# Patient Record
Sex: Male | Born: 1955 | Race: White | Hispanic: No | Marital: Single | Smoking: Current every day smoker
Health system: Southern US, Community
[De-identification: ages and names within clinical notes are randomized; demographics above are authoritative.]

## PROBLEM LIST (undated history)

## (undated) DIAGNOSIS — R569 Unspecified convulsions: Secondary | ICD-10-CM

## (undated) DIAGNOSIS — F1010 Alcohol abuse, uncomplicated: Secondary | ICD-10-CM

---

## 2017-02-12 ENCOUNTER — Emergency Department (HOSPITAL_COMMUNITY): Payer: Medicare Other

## 2017-02-12 ENCOUNTER — Encounter (HOSPITAL_COMMUNITY): Payer: Self-pay | Admitting: Emergency Medicine

## 2017-02-12 ENCOUNTER — Emergency Department (HOSPITAL_COMMUNITY)
Admission: EM | Admit: 2017-02-12 | Discharge: 2017-02-12 | Disposition: A | Discharge status: Home / Self Care | Payer: Medicare Other | Attending: Emergency Medicine | Admitting: Emergency Medicine

## 2017-02-12 DIAGNOSIS — R930 Abnormal findings on diagnostic imaging of skull and head, not elsewhere classified: Secondary | ICD-10-CM | POA: Diagnosis not present

## 2017-02-12 DIAGNOSIS — Z79899 Other long term (current) drug therapy: Secondary | ICD-10-CM | POA: Diagnosis not present

## 2017-02-12 DIAGNOSIS — Z7982 Long term (current) use of aspirin: Secondary | ICD-10-CM | POA: Diagnosis not present

## 2017-02-12 DIAGNOSIS — R569 Unspecified convulsions: Secondary | ICD-10-CM | POA: Diagnosis present

## 2017-02-12 DIAGNOSIS — F17210 Nicotine dependence, cigarettes, uncomplicated: Secondary | ICD-10-CM | POA: Insufficient documentation

## 2017-02-12 HISTORY — DX: Alcohol abuse, uncomplicated: F10.10

## 2017-02-12 HISTORY — DX: Unspecified convulsions: R56.9

## 2017-02-12 LAB — CBC WITH DIFFERENTIAL/PLATELET
BASOS ABS: 0.1 10*3/uL (ref 0.0–0.1)
BASOS PCT: 1 %
Eosinophils Absolute: 0.2 10*3/uL (ref 0.0–0.7)
Eosinophils Relative: 2 %
HEMATOCRIT: 45.3 % (ref 39.0–52.0)
HEMOGLOBIN: 15.7 g/dL (ref 13.0–17.0)
Lymphocytes Relative: 28 %
Lymphs Abs: 2.4 10*3/uL (ref 0.7–4.0)
MCH: 31 pg (ref 26.0–34.0)
MCHC: 34.7 g/dL (ref 30.0–36.0)
MCV: 89.3 fL (ref 78.0–100.0)
Monocytes Absolute: 0.7 10*3/uL (ref 0.1–1.0)
Monocytes Relative: 9 %
Neutro Abs: 5.3 10*3/uL (ref 1.7–7.7)
Neutrophils Relative %: 60 %
Platelets: 321 10*3/uL (ref 150–400)
RBC: 5.07 MIL/uL (ref 4.22–5.81)
RDW: 15.5 % (ref 11.5–15.5)
WBC: 8.6 10*3/uL (ref 4.0–10.5)

## 2017-02-12 LAB — ETHANOL: ALCOHOL ETHYL (B): 7 mg/dL — AB (ref <5)

## 2017-02-12 LAB — COMPREHENSIVE METABOLIC PANEL
ALBUMIN: 4 g/dL (ref 3.5–5.0)
ALK PHOS: 91 U/L (ref 38–126)
ALT: 17 U/L (ref 17–63)
AST: 39 U/L (ref 15–41)
Anion gap: 17 — ABNORMAL HIGH (ref 5–15)
BUN: 7 mg/dL (ref 6–20)
CO2: 19 mmol/L — AB (ref 22–32)
Calcium: 9.3 mg/dL (ref 8.9–10.3)
Chloride: 99 mmol/L — ABNORMAL LOW (ref 101–111)
Creatinine, Ser: 0.94 mg/dL (ref 0.61–1.24)
GFR calc Af Amer: >60 mL/min (ref >60)
GFR calc non Af Amer: >60 mL/min (ref >60)
GLUCOSE: 85 mg/dL (ref 65–99)
Potassium: 3.8 mmol/L (ref 3.5–5.1)
Sodium: 135 mmol/L (ref 135–145)
TOTAL PROTEIN: 7.1 g/dL (ref 6.5–8.1)
Total Bilirubin: 1.3 mg/dL — ABNORMAL HIGH (ref 0.3–1.2)

## 2017-02-12 LAB — URINALYSIS, ROUTINE W REFLEX MICROSCOPIC
BILIRUBIN URINE: NEGATIVE
Glucose, UA: NEGATIVE mg/dL
KETONES UR: NEGATIVE mg/dL
Leukocytes, UA: NEGATIVE
Nitrite: NEGATIVE
Protein, ur: NEGATIVE mg/dL
Specific Gravity, Urine: 1.009 (ref 1.005–1.030)
pH: 7 (ref 5.0–8.0)

## 2017-02-12 LAB — RAPID URINE DRUG SCREEN, HOSP PERFORMED
AMPHETAMINES: NOT DETECTED
Barbiturates: NOT DETECTED
Benzodiazepines: POSITIVE — AB
Cocaine: NOT DETECTED
OPIATES: POSITIVE — AB
Tetrahydrocannabinol: NOT DETECTED

## 2017-02-12 LAB — CBG MONITORING, ED: Glucose-Capillary: 93 mg/dL (ref 65–99)

## 2017-02-12 MED ORDER — LORAZEPAM 2 MG/ML IJ SOLN
1.0000 mg | Freq: Once | INTRAMUSCULAR | Status: AC
  Administered 2017-02-12: 1 mg via INTRAVENOUS
  Filled 2017-02-12: qty 1

## 2017-02-12 MED ORDER — SODIUM CHLORIDE 0.9 % IV BOLUS (SEPSIS)
1000.0000 mL | Freq: Once | INTRAVENOUS | Status: AC
  Administered 2017-02-12: 1000 mL via INTRAVENOUS

## 2017-02-12 NOTE — ED Provider Notes (Signed)
[facility]-EMERGENCY DEPT Provider Note   CSN: [id number] Arrival date & time: 02/12/17  1609     History   Chief Complaint Chief Complaint  Patient presents with  . Seizures    HPI Adonte Vanriper is a 61 y.o. male.  HPI  61 year old male with a history of a couple seizures in the last 10 years that had 2 back-to-back tonic-clonic appearing episodes today witnessed by his daughter. He said he felt lightheaded and dizzy throughout the day and his daughter witnessed him lose consciousness and had jerking of both arms and legs. No tongue biting or incontinence. Sounds like he woke up within a few minutes was back to normal but a little bit confused about what had happened but otherwise normal. While he was unconscious his eyes were deviated up and to the left. There is a question about whether he is an alcoholic, however voices he doesn't drink more than a couple per day and he actually had some today. No headaches or vision changes. At this time no neck pain, back pain, dull pain, chest pain, headache or other associated or modifying factors. Does not take medicine for seizures at home. He was on chronic benzodiazepines but stopped those a couple months ago  Past Medical History:  Diagnosis Date  . Alcohol abuse   . Seizures (HCC)     There are no active problems to display for this patient.   History reviewed. No pertinent surgical history.     Home Medications    Prior to Admission medications   Medication Sig Start Date End Date Taking? Authorizing Provider  Aspirin-Acetaminophen-Caffeine (GOODY HEADACHE PO) Take 2 packets by mouth as needed (headache).   Yes Historical Provider, MD  citalopram (CELEXA) 40 MG tablet Take 40 mg by mouth daily.   Yes Historical Provider, MD  HYDROcodone-acetaminophen (NORCO) 10-325 MG tablet Take 1 tablet by mouth every 8 (eight) hours as needed (pain).   Yes Historical Provider, MD  lisinopril (PRINIVIL,ZESTRIL) 10 MG tablet Take 10 mg by mouth  daily.   Yes Historical Provider, MD  magnesium oxide (MAG-OX) 400 MG tablet Take 400 mg by mouth daily.   Yes Historical Provider, MD  meloxicam (MOBIC) 15 MG tablet Take 15 mg by mouth daily.   Yes Historical Provider, MD  omeprazole (PRILOSEC) 40 MG capsule Take 40 mg by mouth daily.   Yes Historical Provider, MD  sodium bicarbonate 325 MG tablet Take 325 mg by mouth 3 (three) times daily.   Yes Historical Provider, MD    Family History No family history on file.  Social History Social History  Substance Use Topics  . Smoking status: Current Every Day Smoker    Packs/day: 1.00    Types: Cigarettes  . Smokeless tobacco: Never Used  . Alcohol use Yes     Allergies   Codeine   Review of Systems Review of Systems  All other systems reviewed and are negative.    Physical Exam Updated Vital Signs BP 168/94 (BP Location: Right Arm)   Pulse 95   Temp 97.9 F (36.6 C) (Oral)   Resp 16   Ht 5\' 8"  (1.727 m)   Wt 140 lb (63.5 kg)   SpO2 95%   BMI 21.29 kg/m   Physical Exam  Constitutional: He is oriented to person, place, and time. He appears well-developed and well-nourished.  HENT:  Head: Normocephalic and atraumatic.  Eyes: Conjunctivae and EOM are normal. Pupils are equal, round, and reactive to light.  Neck: Normal range  of motion.  Cardiovascular: Regular rhythm.  Tachycardia present.   Also is hypertensive  Pulmonary/Chest: Effort normal. No respiratory distress.  Abdominal: He exhibits no distension.  Musculoskeletal: Normal range of motion. He exhibits no edema or deformity.  Neurological: He is alert and oriented to person, place, and time. He has normal strength. He displays tremor. GCS eye subscore is 4. GCS verbal subscore is 5. GCS motor subscore is 6.  Nursing note and vitals reviewed.    ED Treatments / Results  Labs (all labs ordered are listed, but only abnormal results are displayed) Labs Reviewed  COMPREHENSIVE METABOLIC PANEL - Abnormal;  Notable for the following:       Result Value   Chloride 99 (*)    CO2 19 (*)    Total Bilirubin 1.3 (*)    Anion gap 17 (*)    All other components within normal limits  URINALYSIS, ROUTINE W REFLEX MICROSCOPIC - Abnormal; Notable for the following:    Hgb urine dipstick MODERATE (*)    Bacteria, UA FEW (*)    Squamous Epithelial / LPF 0-5 (*)    All other components within normal limits  ETHANOL - Abnormal; Notable for the following:    Alcohol, Ethyl (B) 7 (*)    All other components within normal limits  RAPID URINE DRUG SCREEN, HOSP PERFORMED - Abnormal; Notable for the following:    Opiates POSITIVE (*)    Benzodiazepines POSITIVE (*)    All other components within normal limits  CBC WITH DIFFERENTIAL/PLATELET  CBG MONITORING, ED    EKG  EKG Interpretation  Date/Time:  Saturday February 12 2017 16:16:08 EST Ventricular Rate:  110 PR Interval:    QRS Duration: 86 QT Interval:  333 QTC Calculation: 451 R Axis:   61 Text Interpretation:  Sinus tachycardia Confirmed by [facility] MD, Barbara Cower [phone]) on 02/12/2017 5:43:44 PM       Radiology Ct Head Wo Contrast  Result Date: 02/12/2017 CLINICAL DATA:  Seizure activity. EXAM: CT HEAD WITHOUT CONTRAST TECHNIQUE: Contiguous axial images were obtained from the base of the skull through the vertex without intravenous contrast. COMPARISON:  There is no evidence for acute hemorrhage, hydrocephalus, mass lesion, or abnormal extra-axial fluid collection. No definite CT evidence for acute infarction. FINDINGS: Brain: There is no evidence for acute hemorrhage, hydrocephalus, mass lesion, or abnormal extra-axial fluid collection. No definite CT evidence for acute infarction. Vascular: No hyperdense vessel or unexpected calcification. Skull: Normal. Negative for fracture or focal lesion. Sinuses/Orbits: No acute finding. Other: None. IMPRESSION: No acute intracranial abnormality. Electronically Signed   By: Kennith Center M.D.   On: 02/12/2017 18:34     Procedures Procedures (including critical care time)  Medications Ordered in ED Medications  sodium chloride 0.9 % bolus 1,000 mL (0 mLs Intravenous Stopped 02/12/17 2016)  LORazepam (ATIVAN) injection 1 mg (1 mg Intravenous Given 02/12/17 1817)     Initial Impression / Assessment and Plan / ED Course  I have reviewed the triage vital signs and the nursing notes.  Pertinent labs & imaging results that were available during my care of the patient were reviewed by me and considered in my medical decision making (see chart for details).    Possibly etoh withdrawal, however patient denies significant use. Possibly syncope, will check labs and ct. Will likely need neuro follow up. Workup negative. Suspect possible syncope because of a seizure. Will follow-up with neurology. Will not start any antiepileptics at this time. We'll refer to neurology for same.  Final Clinical Impressions(s) / ED Diagnoses   Final diagnoses:  Seizure-like activity [facility](HCC)     Marily MemosJason Doshia Dalia, MD 02/14/17 0003

## 2017-02-12 NOTE — ED Notes (Signed)
Seizure pads placed on bed

## 2017-02-12 NOTE — ED Notes (Signed)
Bed: [id number] Expected date:  Expected time:  Means of arrival:  Comments: 61 yo seizure w/hx of the same

## 2017-02-12 NOTE — ED Triage Notes (Signed)
Pt comes from home via EMS with complaints of two seizures that occurred today. Pt denies any use of seizure medications.  Daughter reports patient hit the back of his head on the fridge.  Pt does not complain of neck or back pain.  C-collar on on arrival.  Hx of having seizures recently and [facility] and it was attributed to hyponatremia.  Hx of alcohol abuse.  Undisclosed amount consumed today.  No other complaints.  Confused on EMS arrival but improved by time to ED.

## 2017-10-29 IMAGING — CT CT HEAD W/O CM
4 series · 16 of 47 positions shown, 18 images · non-contrast
Comparison: There is no evidence for acute hemorrhage,
hydrocephalus, mass lesion, or abnormal extra-axial fluid
collection. No definite CT evidence for acute infarction.

CLINICAL DATA: Seizure activity.

EXAM:
CT HEAD WITHOUT CONTRAST
TECHNIQUE: Contiguous axial images were obtained from the base of the skull
through the vertex without intravenous contrast.

[Series 2: head w/o · axial · non-contrast · 0.43mm/px · z∈[+1331,+1451]mm · 7 of 33 slices shown, 9 images]
[im 5/33  brain]
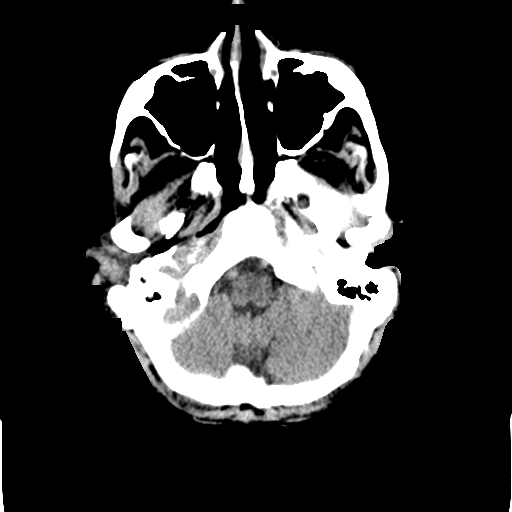
[im 5/33  bone]
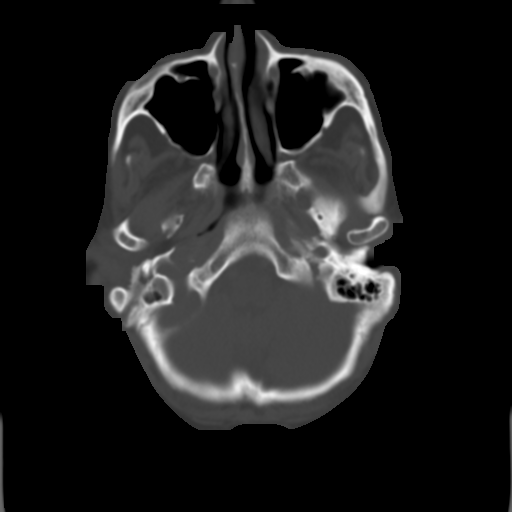
[im 9/33  brain]
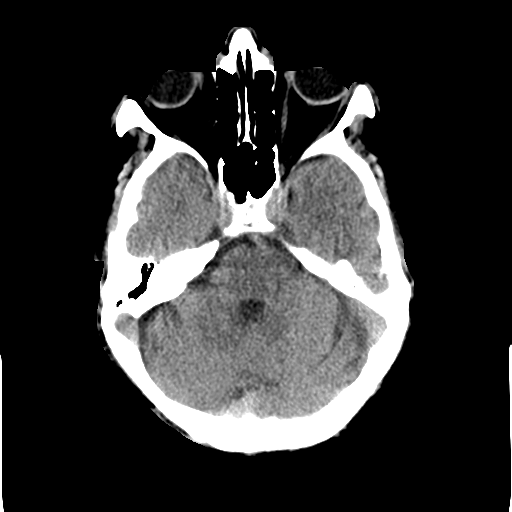
[im 13/33  brain]
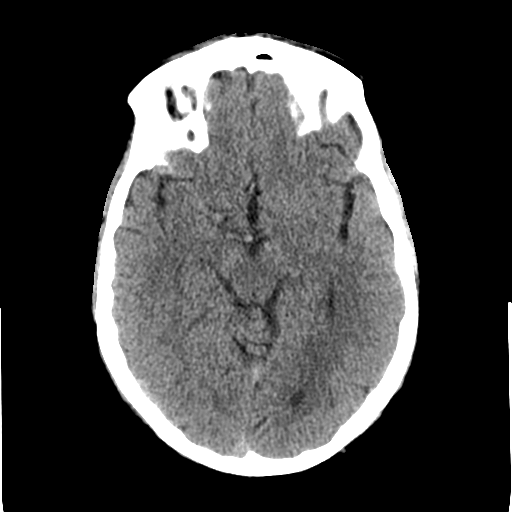
[im 17/33  brain]
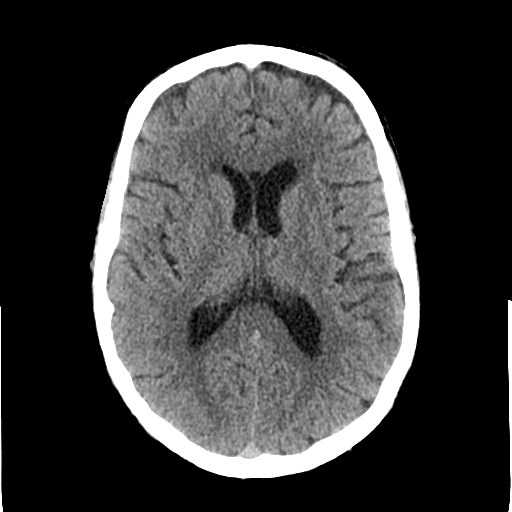
[im 21/33  brain]
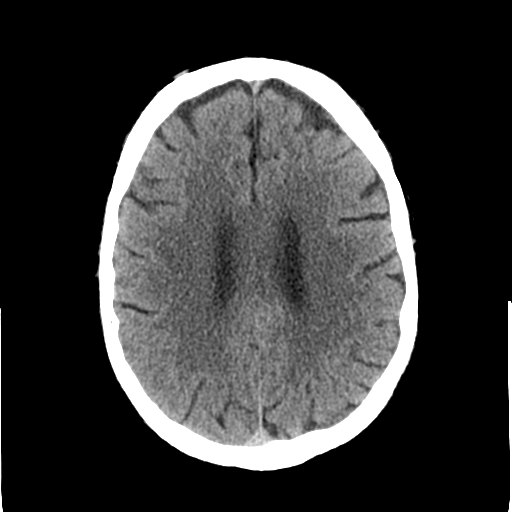
[im 21/33  bone]
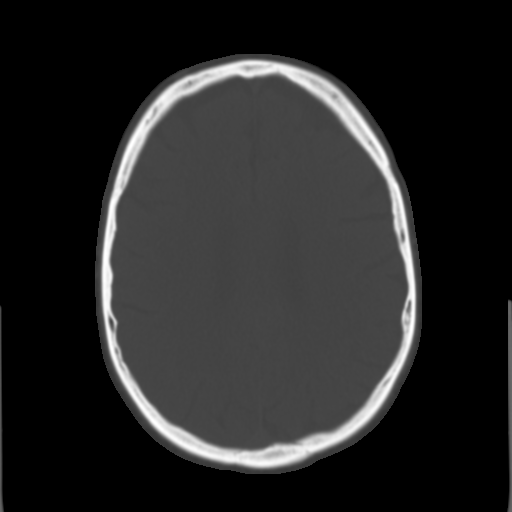
[im 25/33  brain]
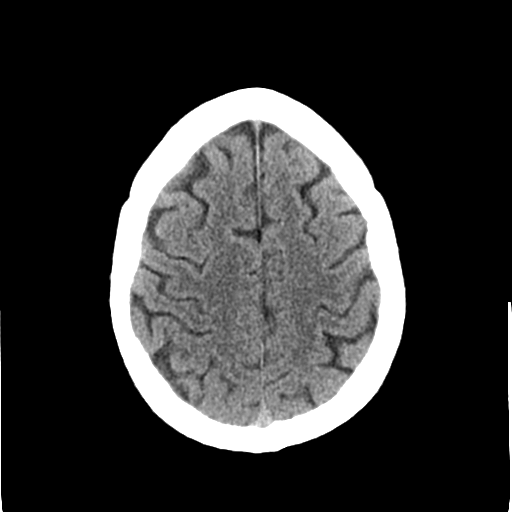
[im 29/33  brain]
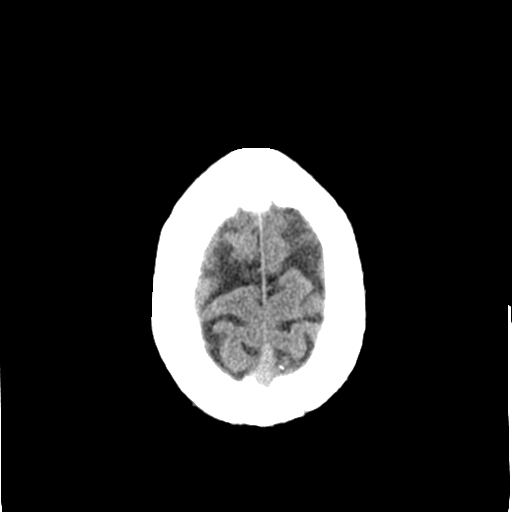

[Series 3: bone windows · axial · 0.43mm/px · z∈[+1327,+1359]mm · 3 of 82 slices shown]
[im 9/82  bone]
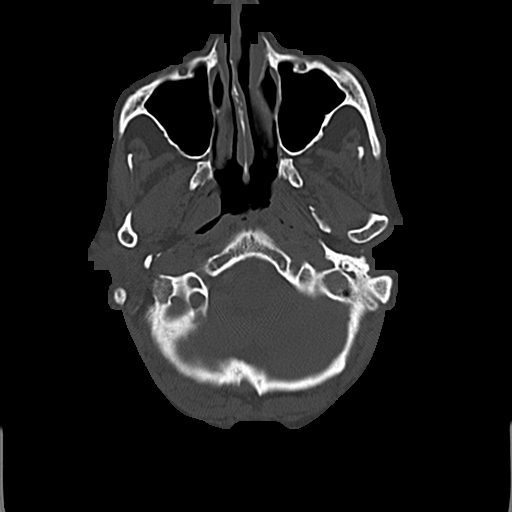
[im 17/82  bone]
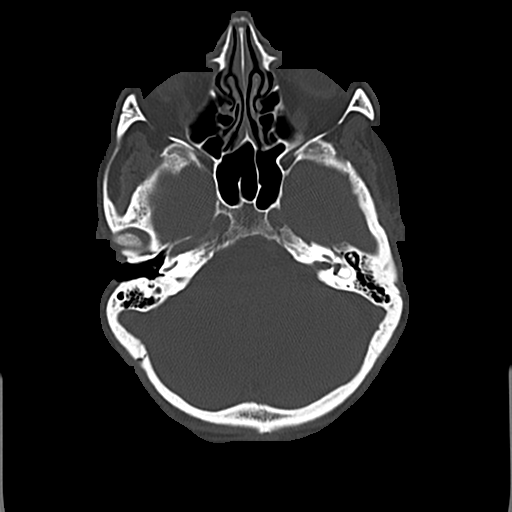
[im 25/82  bone]
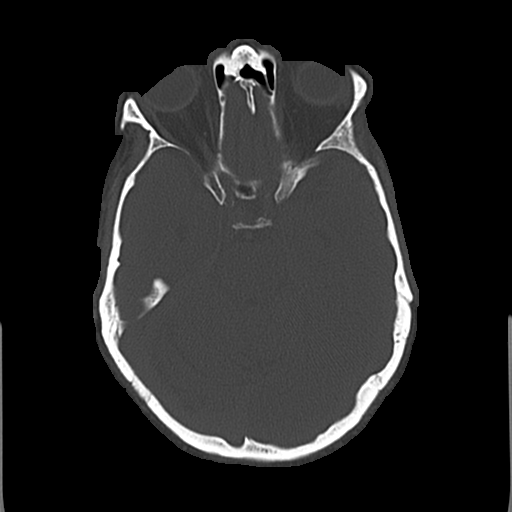

[Series 5: coronal · coronal · 0.33mm/px · 3 of 77 slices shown]
[im 28/77  brain]
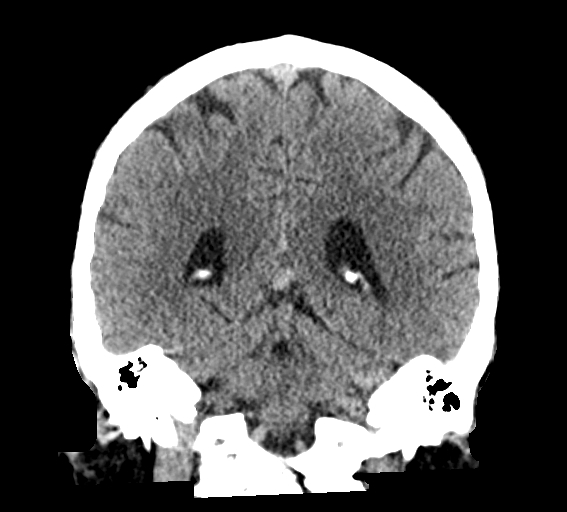
[im 35/77  brain]
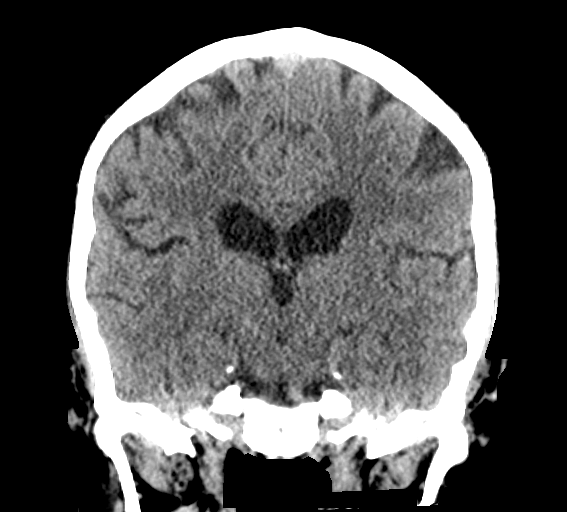
[im 42/77  brain]
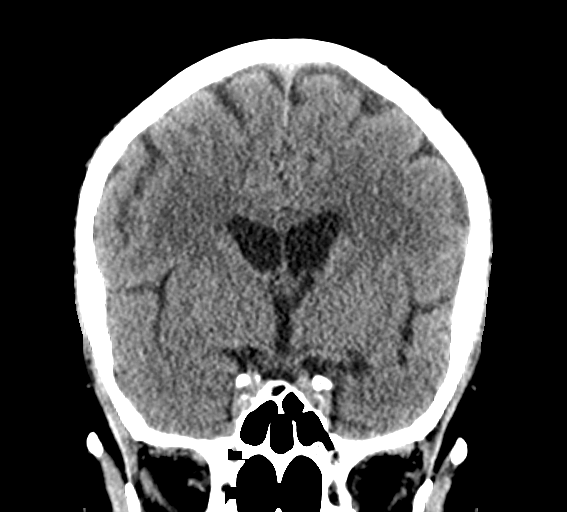

[Series 6: sagittal · sagittal · 0.33mm/px · 3 of 52 slices shown]
[im 18/52  brain]
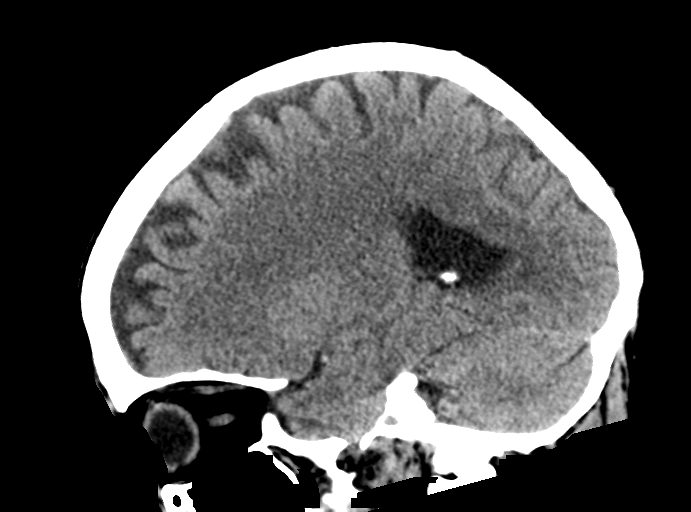
[im 26/52  brain]
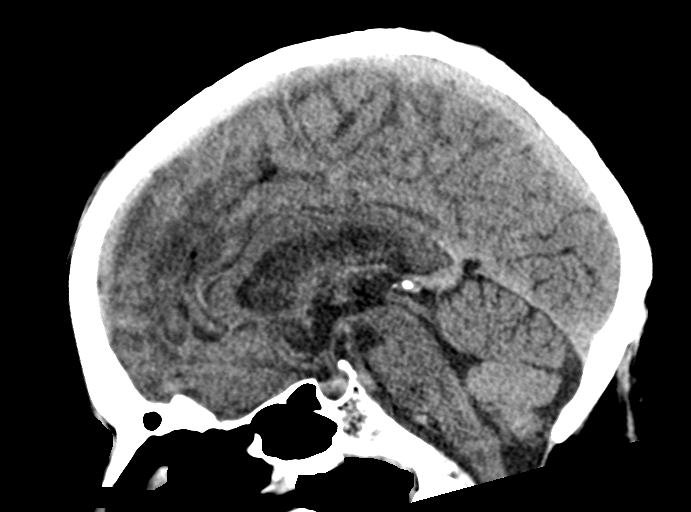
[im 35/52  brain]
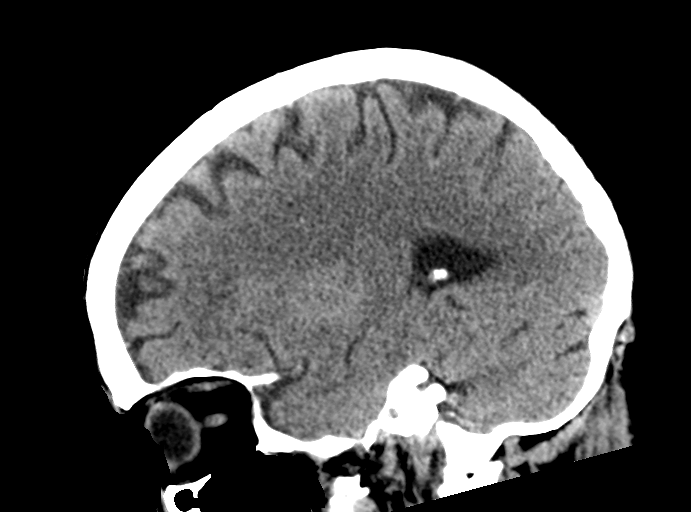

[16 of 47 positions shown; findings below may reference images not displayed]

FINDINGS: Brain: There is no evidence for acute hemorrhage, hydrocephalus,
mass lesion, or abnormal extra-axial fluid collection. No definite
CT evidence for acute infarction.

Vascular: No hyperdense vessel or unexpected calcification.

Skull: Normal. Negative for fracture or focal lesion.

Sinuses/Orbits: No acute finding.

Other: None.
IMPRESSION: No acute intracranial abnormality.

## 2021-09-05 DEATH — deceased
